# Patient Record
Sex: Female | Born: 1955 | Race: Black or African American | Hispanic: No | Marital: Single
Health system: Southern US, Community
[De-identification: ages and names within clinical notes are randomized; demographics above are authoritative.]

---

## 2011-05-22 ENCOUNTER — Emergency Department: Payer: Self-pay | Admitting: Emergency Medicine

## 2011-05-22 LAB — CK TOTAL AND CKMB (NOT AT ARMC): CK, Total: 165 U/L (ref 21–215)

## 2011-05-22 LAB — CBC
HCT: 38.7 % (ref 35.0–47.0)
MCH: 30.9 pg (ref 26.0–34.0)
MCHC: 33.1 g/dL (ref 32.0–36.0)
RDW: 15.1 % — ABNORMAL HIGH (ref 11.5–14.5)

## 2011-05-22 LAB — BASIC METABOLIC PANEL
Anion Gap: 11 (ref 7–16)
BUN: 14 mg/dL (ref 7–18)
Calcium, Total: 8.9 mg/dL (ref 8.5–10.1)
Chloride: 107 mmol/L (ref 98–107)
Co2: 26 mmol/L (ref 21–32)
Glucose: 97 mg/dL (ref 65–99)
Osmolality: 287 (ref 275–301)

## 2011-05-22 LAB — TROPONIN I: Troponin-I: <0.02 ng/mL

## 2012-11-02 IMAGING — CR DG CHEST 2V
1 series · 3 of 3 positions shown · non-contrast
Comparison: none

REASON FOR EXAM: chest pain
COMMENTS:

[Series 1: pa · 0.17mm/px · 3 of 3 slices shown]
[im 1/3]
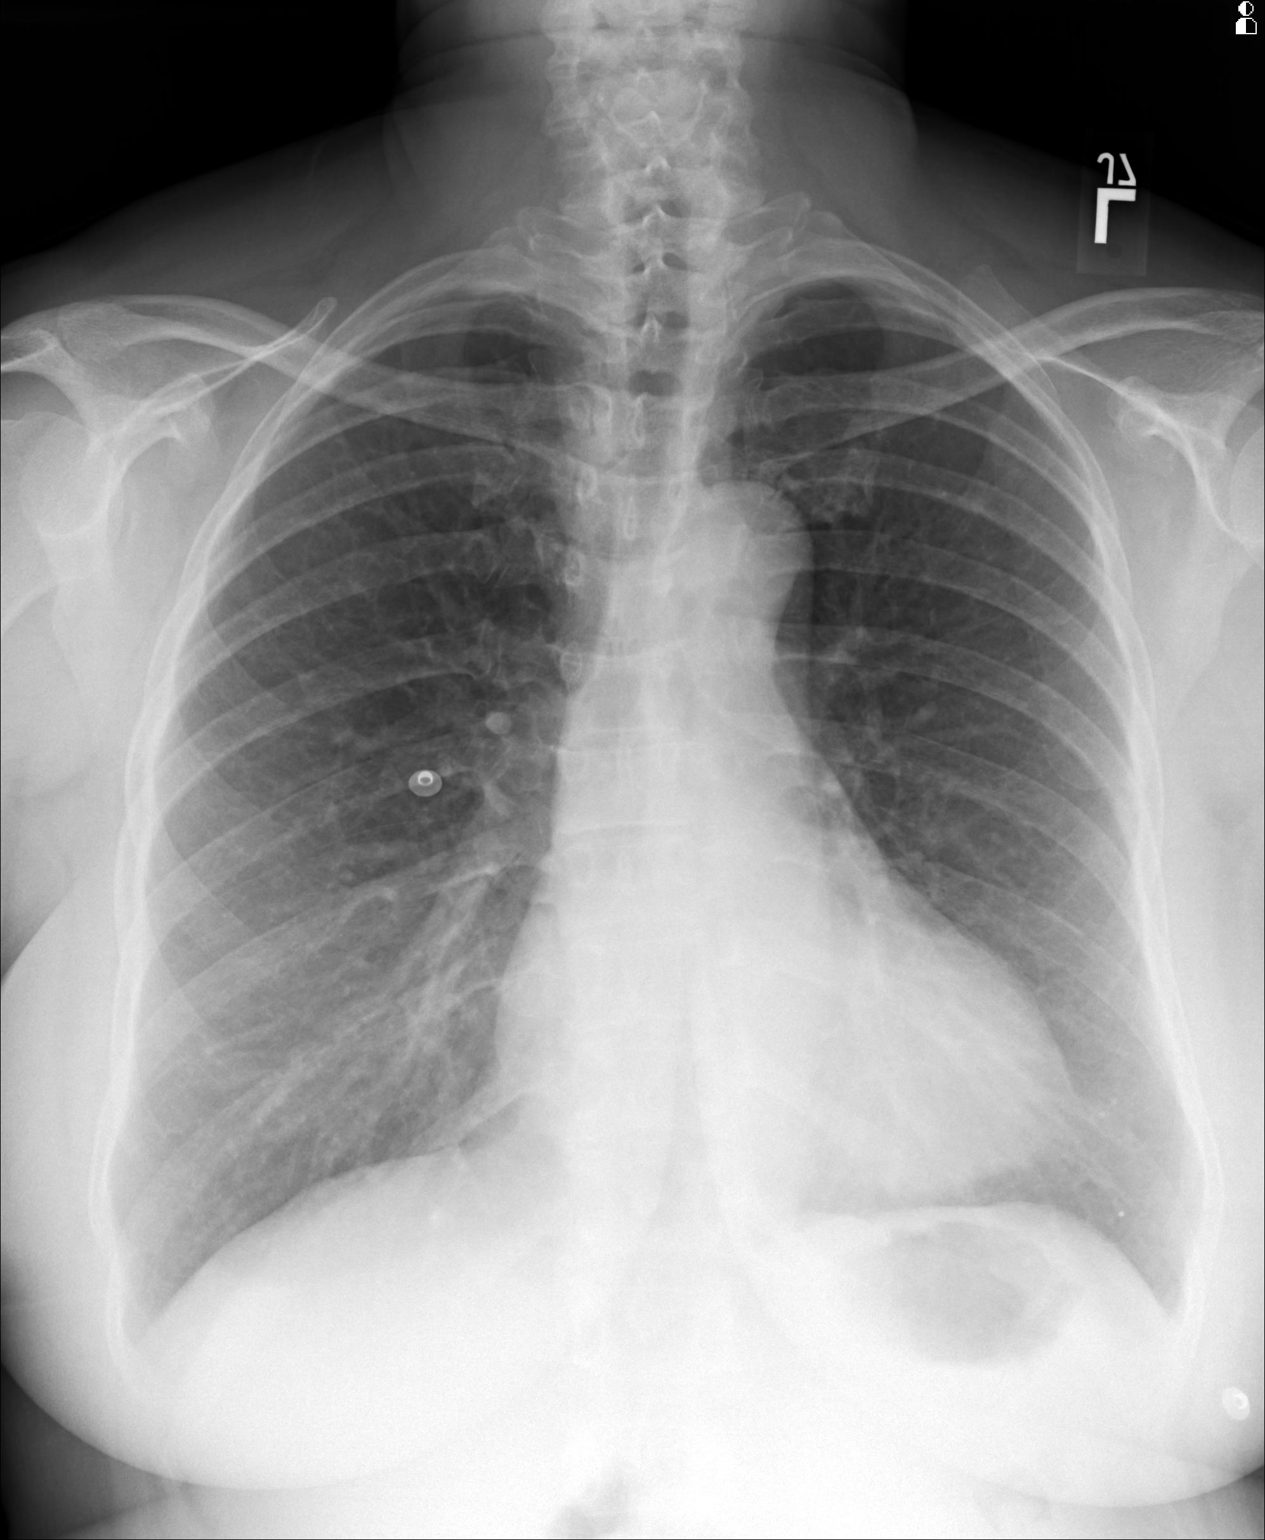
[im 2/3]
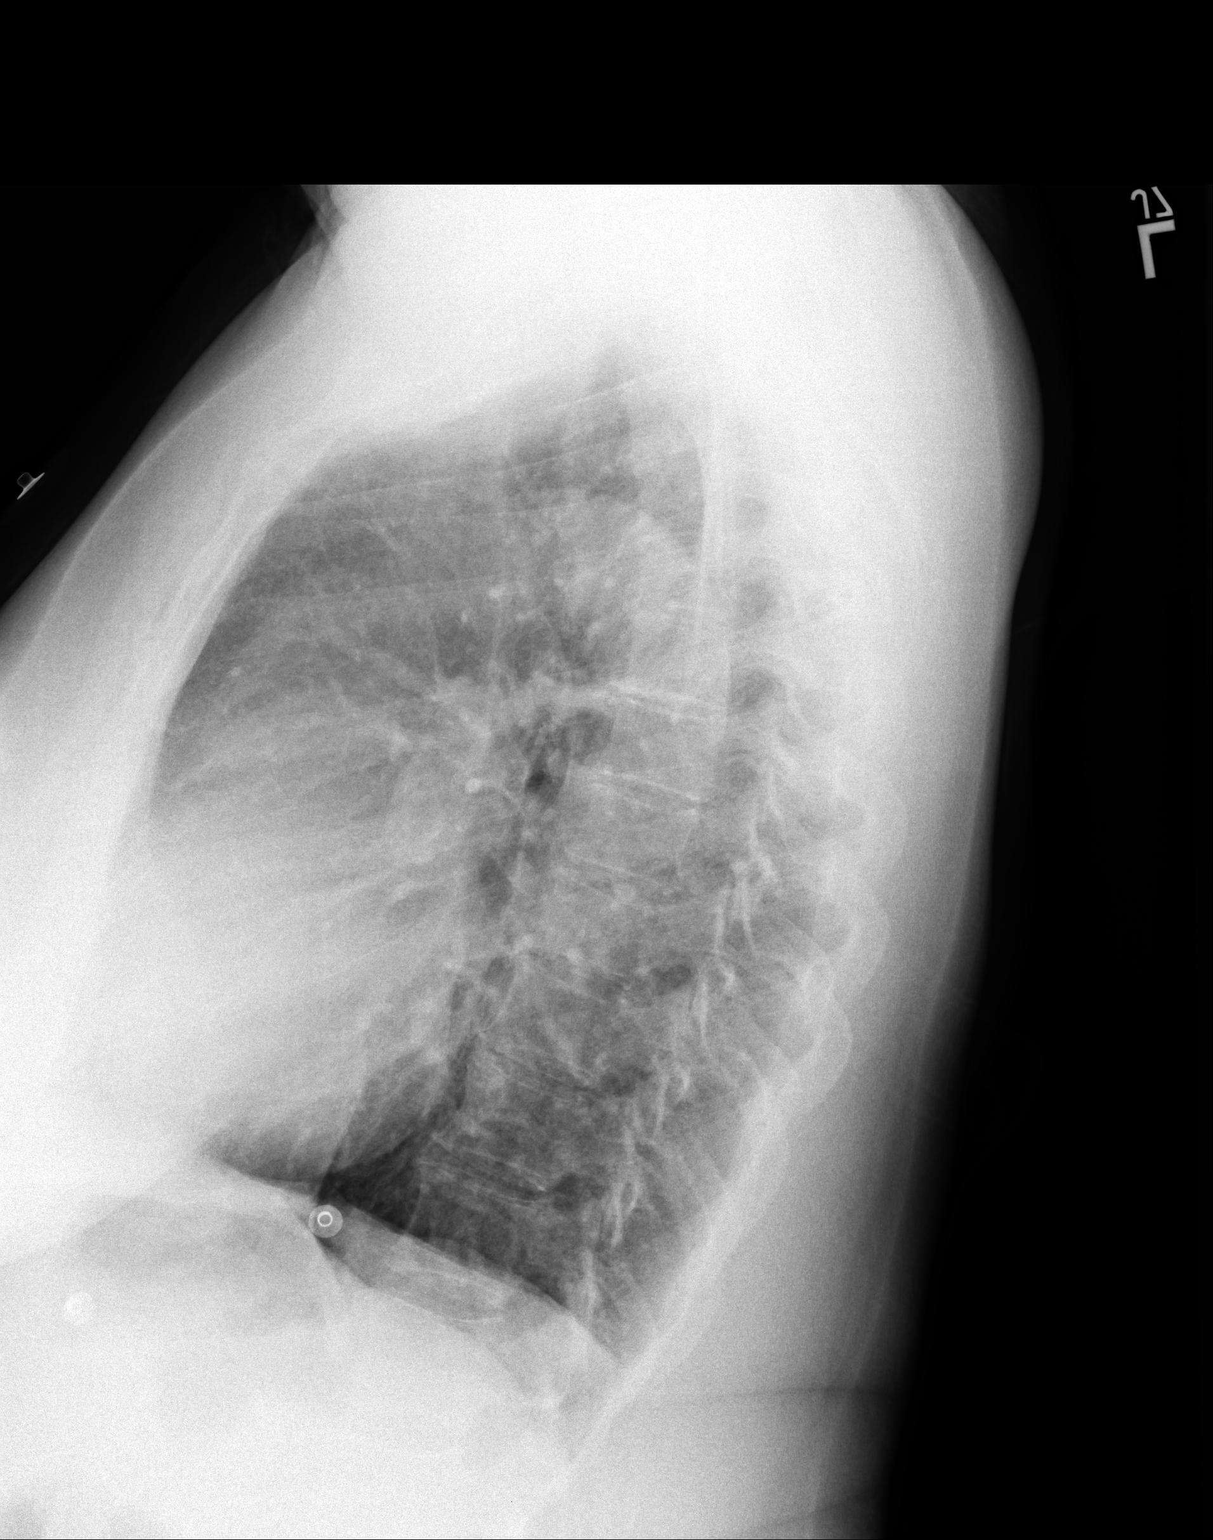
[im 3/3]
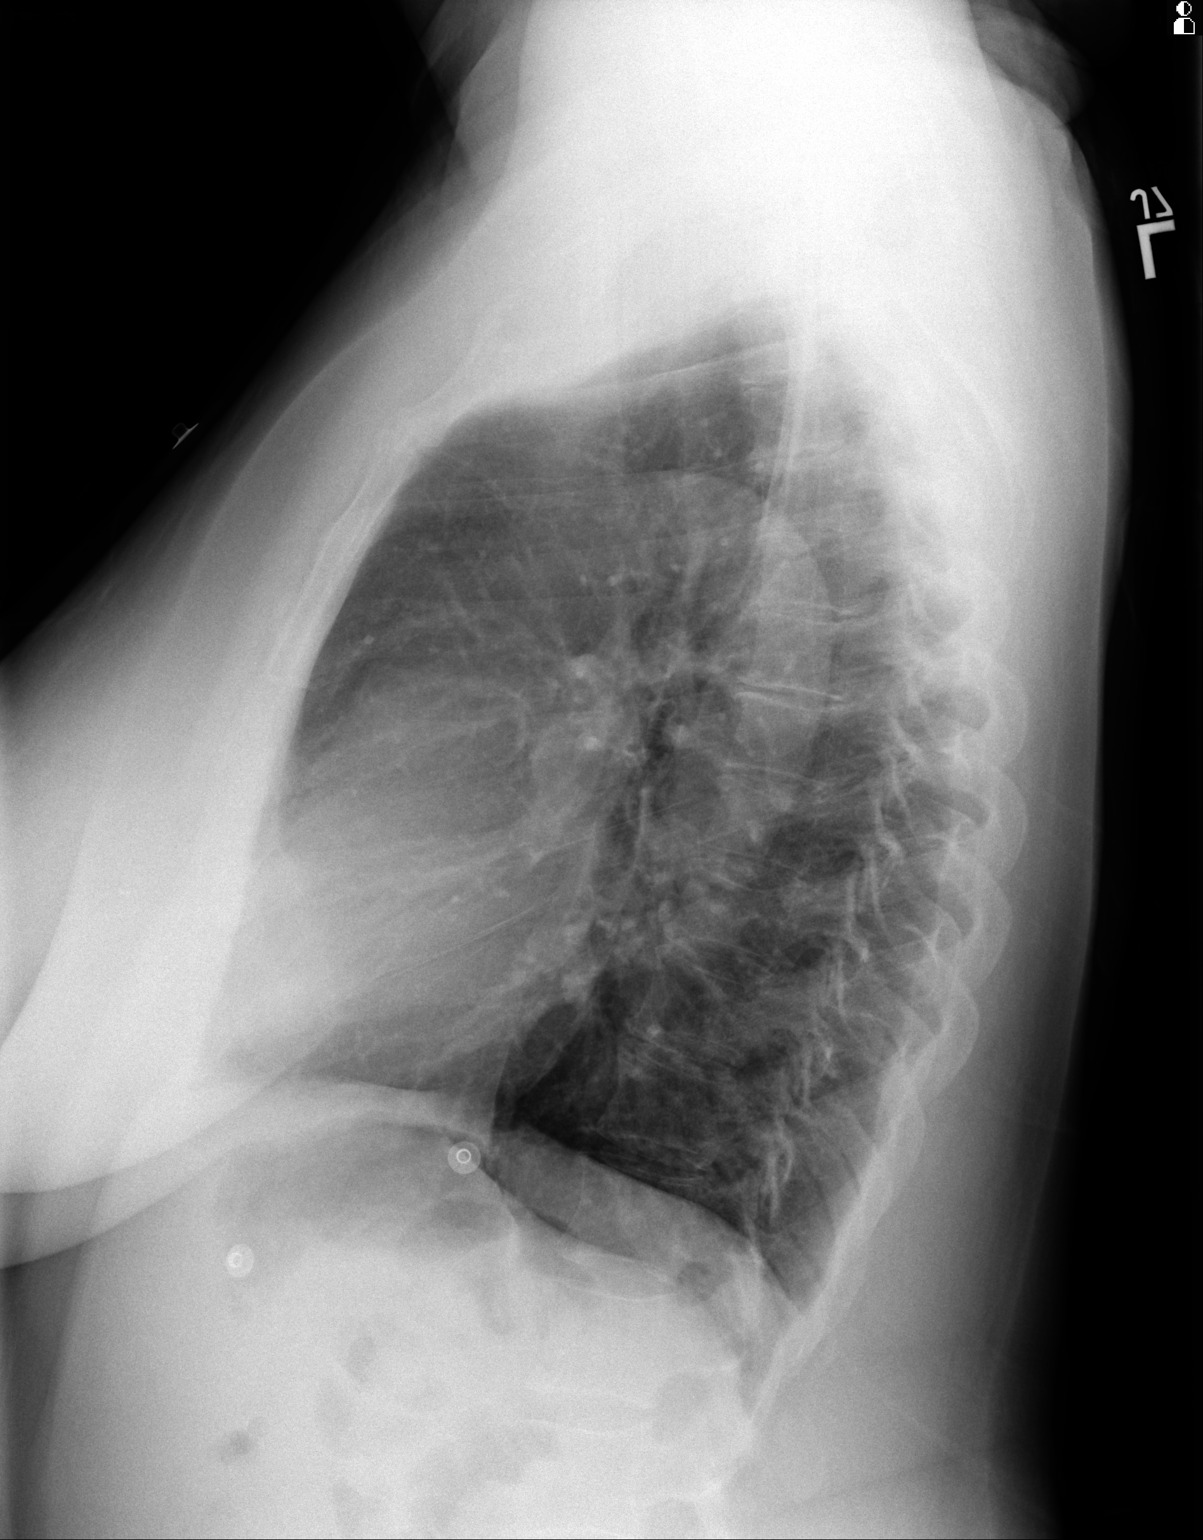

[3 of 3 positions shown; findings below may reference images not displayed]

PROCEDURE:     DXR - DXR CHEST PA (OR AP) AND LATERAL  - May 22, 2011  [DATE]

RESULT:     The lung fields are clear. No pneumonia, pneumothorax or pleural
effusion is seen. The heart is upper limits for normal in size or slightly
enlarged. No acute bony abnormalities are seen. The chest appears mildly
hyperinflated bilaterally.
IMPRESSION: 1. The lung fields are clear.
2. The chest appears mildly hyperinflated bilaterally.
3. The heart is upper limits for normal in size or slightly enlarged.

## 2014-08-08 NOTE — Consult Note (Signed)
PATIENT NAME:  Hayley Bradford, Hayley Bradford MR#:  [id number] DATE OF BIRTH:  02/07/1956  DATE OF CONSULTATION:  05/22/2011  REFERRING PHYSICIAN:   CONSULTING PHYSICIAN:  Hayley Bradford A. Hayley Katz, MD  PRIMARY CARE PHYSICIAN: Dr. Deloria Bradford at [facility]   CHIEF COMPLAINT: Chest burning with headache.   HISTORY OF PRESENT ILLNESS: Ms. Hayley Bradford is a pleasant 59 year old African American female with history of hypertension and depression comes to the Emergency Room after she started noticing some chest burning and left arm tingling while she was at the conference this morning. She came to the Emergency Room. Her symptoms had improved. She did have one time some tingling in her right arm. She did have some mild headache since this morning. Denies any other weakness in her lower extremities or upper extremities. She experienced some chest burning which lasted for about 45 minutes and resolved after she took nitroglycerin in the morning. Patient denies any chest pain at this time. Her blood pressure is borderline elevated, 156/78. She took her amlodipine this morning along with her aspirin. Her EKG is normal sinus rhythm and her cardiac enzymes are negative.   PAST MEDICAL HISTORY: 1. Hypertension. 2. Depression, anxiety.   MEDICATIONS:  1. Effexor-XR 75 mg daily.  2. Aspirin 81 mg daily.  3. Amlodipine 5 mg daily.    REVIEW OF SYSTEMS: CONSTITUTIONAL: No fever, fatigue, weakness. EYES: No blurred or double vision. ENT: No tinnitus, ear pain, hearing loss. RESPIRATORY: No cough, wheeze, hemoptysis. CARDIOVASCULAR: Earlier chest burning, resolved. Positive for hypertension. GASTROINTESTINAL: No nausea, vomiting, diarrhea, abdominal pain. GENITOURINARY: No dysuria, hematuria. ENDOCRINE: No polyuria or nocturia. HEMATOLOGY: No anemia or easy bruising. SKIN: No acne, rash. MUSCULOSKELETAL: Positive for arthritis. NEUROLOGICAL: No cerebrovascular accident, transient ischemic attack. Positive for headache. PSYCH: Positive for  anxiety and depression. All other systems reviewed and negative.   FAMILY HISTORY: Positive for hypertension in mother.   SOCIAL HISTORY: Patient is a chronic smoker. She says she smokes less than a pack a day. Nonalcoholic.   ALLERGIES: No known drug allergies.   PHYSICAL EXAMINATION:  GENERAL: Patient is awake, alert, oriented x3, not in acute distress.   VITAL SIGNS: Afebrile, pulse 89, blood pressure 155/82, sats 94% on room air.   HEENT: Atraumatic, normocephalic. Pupils are equal, round, and reactive to light and accommodation. Extraocular movements are intact. Oral mucosa is moist.    NECK: Supple. No JVD. No carotid bruit.   RESPIRATORY: Clear to auscultation bilaterally. No rales, rhonchi, respiratory distress or labored breathing.  CARDIOVASCULAR: Both the heart sounds are normal. Rate, rhythm is regular. PMI not lateralized. Chest nontender.   EXTREMITIES: Good pedal pulses, good femoral pulses. No lower extremity edema.   ABDOMEN: Soft, benign, nontender. No organomegaly. Positive bowel sounds.   NEUROLOGIC: Grossly intact cranial nerves II through XII. No motor or sensory deficits.   PSYCH: Patient is awake, alert, oriented x3.   LABORATORY, DIAGNOSTIC AND RADIOLOGICAL DATA: EKG shows normal sinus rhythm. No acute ST elevation or depression. CBC within normal limits. Basic metabolic panel within normal limits. Cardiac enzymes, first set negative.    ASSESSMENT: 59 year old Ms. Hayley Bradford with:  1. Chest burning with left arm tingling, resolved now. Negative EKG. Negative cardiac enzymes.  2. Hypertension. Mildly elevated. SBP in the 150s.  3. Depression, anxiety.  4. Headache, mild.   PLAN: Patient is hemodynamically stable. Vitals stable. Case was discussed with Dr. Mariah Bradford. Since patient is feeling better it is okay for her to go home. Follow up with PCP and cardiology in  her area. Patient was given Dr. Windell Bradford's contact number in case of need tonight. She is also  advised to return to the Emergency Room if signs, symptoms get worse. Family in the Emergency Room voiced understanding. Patient was asked to increase her Norvasc to 5 mg b.i.d., take p.r.n. nitroglycerin; prescription for both were given along with continue baby aspirin a day. She is also advised on smoking cessation about 3-4 minutes was spent and she is also advised to take low salt diet. Patient did voice understanding above instructions. Patient will be discharged to home. Follow up with her primary care physician this week and cardiology in the area first available appointment for further work-up.  TIME SPENT: 45 minutes.   ____________________________ Hayley HailSona A. Hayley KatzPatel, MD sap:cms D: 05/22/2011 17:46:54 ET T: 05/23/2011 08:02:56 ET JOB#: [id number]  cc: Hayley Bradford A. Hayley KatzPatel, MD, <Dictator> Dr.  [facility]Hamilton [facility] Hayley A Halil Rentz MD ELECTRONICALLY SIGNED 05/24/2011 7:40

## 2021-08-21 ENCOUNTER — Institutional Professional Consult (permissible substitution): Payer: Self-pay | Admitting: Plastic Surgery
